# Patient Record
Sex: Female | Born: 1985 | Hispanic: No | Marital: Single | Smoking: Current every day smoker
Health system: Southern US, Community
[De-identification: ages and names within clinical notes are randomized; demographics above are authoritative.]

## PROBLEM LIST (undated history)

## (undated) ENCOUNTER — Inpatient Hospital Stay (HOSPITAL_COMMUNITY): Payer: Self-pay

## (undated) DIAGNOSIS — F419 Anxiety disorder, unspecified: Secondary | ICD-10-CM

## (undated) DIAGNOSIS — F329 Major depressive disorder, single episode, unspecified: Secondary | ICD-10-CM

## (undated) DIAGNOSIS — F32A Depression, unspecified: Secondary | ICD-10-CM

## (undated) HISTORY — PX: TYMPANOSTOMY TUBE PLACEMENT: SHX32

---
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
## 2011-06-24 ENCOUNTER — Emergency Department (HOSPITAL_COMMUNITY)
Admission: EM | Admit: 2011-06-24 | Discharge: 2011-06-24 | Disposition: A | Discharge status: Home / Self Care | Payer: Self-pay

## 2013-08-13 ENCOUNTER — Emergency Department (HOSPITAL_COMMUNITY)
Admission: EM | Admit: 2013-08-13 | Discharge: 2013-08-13 | Disposition: A | Discharge status: Home / Self Care | Payer: Self-pay | Attending: Emergency Medicine | Admitting: Emergency Medicine

## 2013-08-13 ENCOUNTER — Encounter (HOSPITAL_COMMUNITY): Payer: Self-pay | Admitting: Emergency Medicine

## 2013-08-13 DIAGNOSIS — M549 Dorsalgia, unspecified: Secondary | ICD-10-CM | POA: Insufficient documentation

## 2013-08-13 DIAGNOSIS — Z8659 Personal history of other mental and behavioral disorders: Secondary | ICD-10-CM | POA: Insufficient documentation

## 2013-08-13 DIAGNOSIS — R51 Headache: Secondary | ICD-10-CM | POA: Insufficient documentation

## 2013-08-13 DIAGNOSIS — F17200 Nicotine dependence, unspecified, uncomplicated: Secondary | ICD-10-CM | POA: Insufficient documentation

## 2013-08-13 DIAGNOSIS — R519 Headache, unspecified: Secondary | ICD-10-CM

## 2013-08-13 DIAGNOSIS — G479 Sleep disorder, unspecified: Secondary | ICD-10-CM | POA: Insufficient documentation

## 2013-08-13 DIAGNOSIS — R079 Chest pain, unspecified: Secondary | ICD-10-CM | POA: Insufficient documentation

## 2013-08-13 DIAGNOSIS — F430 Acute stress reaction: Secondary | ICD-10-CM | POA: Insufficient documentation

## 2013-08-13 HISTORY — DX: Anxiety disorder, unspecified: F41.9

## 2013-08-13 HISTORY — DX: Major depressive disorder, single episode, unspecified: F32.9

## 2013-08-13 HISTORY — DX: Depression, unspecified: F32.A

## 2013-08-13 LAB — CBC WITH DIFFERENTIAL/PLATELET
Basophils Absolute: 0 10*3/uL (ref 0.0–0.1)
Basophils Relative: 1 % (ref 0–1)
Eosinophils Absolute: 0.1 10*3/uL (ref 0.0–0.7)
Eosinophils Relative: 2 % (ref 0–5)
HCT: 37.1 % (ref 36.0–46.0)
HEMOGLOBIN: 12.8 g/dL (ref 12.0–15.0)
Lymphocytes Relative: 24 % (ref 12–46)
Lymphs Abs: 1.2 10*3/uL (ref 0.7–4.0)
MCH: 33.1 pg (ref 26.0–34.0)
MCHC: 34.5 g/dL (ref 30.0–36.0)
MCV: 95.9 fL (ref 78.0–100.0)
Monocytes Absolute: 0.6 10*3/uL (ref 0.1–1.0)
Monocytes Relative: 12 % (ref 3–12)
NEUTROS ABS: 3.1 10*3/uL (ref 1.7–7.7)
NEUTROS PCT: 62 % (ref 43–77)
PLATELETS: 218 10*3/uL (ref 150–400)
RBC: 3.87 MIL/uL (ref 3.87–5.11)
RDW: 12.1 % (ref 11.5–15.5)
WBC: 5 10*3/uL (ref 4.0–10.5)

## 2013-08-13 MED ORDER — METOCLOPRAMIDE HCL 5 MG/ML IJ SOLN
10.0000 mg | Freq: Once | INTRAMUSCULAR | Status: AC
  Administered 2013-08-13: 10 mg via INTRAMUSCULAR
  Filled 2013-08-13: qty 2

## 2013-08-13 MED ORDER — DIPHENHYDRAMINE HCL 50 MG/ML IJ SOLN
25.0000 mg | Freq: Once | INTRAMUSCULAR | Status: AC
  Administered 2013-08-13: 25 mg via INTRAMUSCULAR
  Filled 2013-08-13: qty 1

## 2013-08-13 MED ORDER — OXYCODONE-ACETAMINOPHEN 5-325 MG PO TABS
2.0000 | ORAL_TABLET | Freq: Once | ORAL | Status: AC
  Administered 2013-08-13: 2 via ORAL
  Filled 2013-08-13: qty 2

## 2013-08-13 MED ORDER — KETOROLAC TROMETHAMINE 60 MG/2ML IM SOLN
60.0000 mg | Freq: Once | INTRAMUSCULAR | Status: AC
  Administered 2013-08-13: 60 mg via INTRAMUSCULAR
  Filled 2013-08-13: qty 2

## 2013-08-13 NOTE — Discharge Instructions (Signed)

## 2013-08-13 NOTE — ED Notes (Signed)
Pt states headache for 2 weeks.  Pt states eye movement hurts.  Ibuprofen is not working.  ASA, excedrin, goody's.  No migraines in past.  Diarrhea 2 days ago.  No vomiting or fever.  Slight blurred vision.

## 2013-08-13 NOTE — ED Provider Notes (Signed)
CSN: [id number]     Arrival date & time 08/13/13  1840 History   First MD Initiated Contact with Patient 08/13/13 2003     Chief Complaint  Patient presents with  . Migraine     (Consider location/radiation/quality/duration/timing/severity/associated sxs/prior Treatment) Patient is a 28 y.o. female presenting with migraines. The history is provided by the patient.  Migraine   patient here complaining of bitemporal headache x2 weeks. Denies any fever or neck pain. No photophobia. Headache has been persistent. She notes increased stress and decreased sleep. Denies any rashes. No recent travel history. Also notes musculoskeletal pain in her back and chest worse with movement. She works as a Child psychotherapistwaitress. Denies any syncope or near-syncope. Symptoms persisted. Has used ibuprofen without relief. Denies any focal neurological weakness.  Past Medical History  Diagnosis Date  . Anxiety   . Depression    Past Surgical History  Procedure Laterality Date  . Tympanostomy tube placement     History reviewed. No pertinent family history. History  Substance Use Topics  . Smoking status: Current Every Day Smoker -- 1.00 packs/day  . Smokeless tobacco: Not on file  . Alcohol Use: Yes     Comment: social   OB History   Grav Para Term Preterm Abortions TAB SAB Ect Mult Living                 Review of Systems  All other systems reviewed and are negative.     Allergies  Review of patient's allergies indicates no known allergies.  Home Medications   Prior to Admission medications   Not on File   BP 119/88  Pulse 79  Temp(Src) 98.2 F (36.8 C) (Oral)  Resp 16  SpO2 100%  LMP 08/06/2013 Physical Exam  Nursing note and vitals reviewed. Constitutional: She is oriented to person, place, and time. She appears well-developed and well-nourished.  Non-toxic appearance. No distress.  HENT:  Head: Normocephalic and atraumatic.  Eyes: Conjunctivae, EOM and lids are normal. Pupils are equal,  round, and reactive to light.  Neck: Normal range of motion. Neck supple. No tracheal deviation present. No mass present.    Cardiovascular: Normal rate, regular rhythm and normal heart sounds.  Exam reveals no gallop.   No murmur heard. Pulmonary/Chest: Effort normal and breath sounds normal. No stridor. No respiratory distress. She has no decreased breath sounds. She has no wheezes. She has no rhonchi. She has no rales.    Abdominal: Soft. Normal appearance and bowel sounds are normal. She exhibits no distension. There is no tenderness. There is no rebound and no CVA tenderness.  Musculoskeletal: Normal range of motion. She exhibits no edema and no tenderness.  Neurological: She is alert and oriented to person, place, and time. She has normal strength. No cranial nerve deficit or sensory deficit. GCS eye subscore is 4. GCS verbal subscore is 5. GCS motor subscore is 6.  Skin: Skin is warm and dry. No abrasion and no rash noted.  Psychiatric: She has a normal mood and affect. Her speech is normal and behavior is normal.    ED Course  Procedures (including critical care time) Labs Review Labs Reviewed - No data to display  Imaging Review No results found.   EKG Interpretation None      MDM   Final diagnoses:  None    Patient says she may be anemic and CBG was checked and shows no signs of anemia. Was given medications for her headache. No red flags for subarachnoid  hemorrhage or meningitis. Repeat neurological exam at time of discharge stable.    Toy BakerAnthony T Deneisha Dade, MD 08/13/13 2228

## 2015-01-14 ENCOUNTER — Emergency Department (HOSPITAL_COMMUNITY): Payer: BLUE CROSS/BLUE SHIELD

## 2015-01-14 ENCOUNTER — Emergency Department (HOSPITAL_COMMUNITY)
Admission: EM | Admit: 2015-01-14 | Discharge: 2015-01-14 | Disposition: A | Discharge status: Home / Self Care | Payer: BLUE CROSS/BLUE SHIELD | Attending: Emergency Medicine | Admitting: Emergency Medicine

## 2015-01-14 ENCOUNTER — Encounter (HOSPITAL_COMMUNITY): Payer: Self-pay | Admitting: Emergency Medicine

## 2015-01-14 DIAGNOSIS — Y9289 Other specified places as the place of occurrence of the external cause: Secondary | ICD-10-CM | POA: Insufficient documentation

## 2015-01-14 DIAGNOSIS — W208XXA Other cause of strike by thrown, projected or falling object, initial encounter: Secondary | ICD-10-CM | POA: Diagnosis not present

## 2015-01-14 DIAGNOSIS — F419 Anxiety disorder, unspecified: Secondary | ICD-10-CM | POA: Diagnosis not present

## 2015-01-14 DIAGNOSIS — S9031XA Contusion of right foot, initial encounter: Secondary | ICD-10-CM | POA: Diagnosis not present

## 2015-01-14 DIAGNOSIS — F329 Major depressive disorder, single episode, unspecified: Secondary | ICD-10-CM | POA: Insufficient documentation

## 2015-01-14 DIAGNOSIS — Y9389 Activity, other specified: Secondary | ICD-10-CM | POA: Diagnosis not present

## 2015-01-14 DIAGNOSIS — Z79899 Other long term (current) drug therapy: Secondary | ICD-10-CM | POA: Diagnosis not present

## 2015-01-14 DIAGNOSIS — Z720 Tobacco use: Secondary | ICD-10-CM | POA: Diagnosis not present

## 2015-01-14 DIAGNOSIS — S99921A Unspecified injury of right foot, initial encounter: Secondary | ICD-10-CM | POA: Diagnosis present

## 2015-01-14 DIAGNOSIS — Y998 Other external cause status: Secondary | ICD-10-CM | POA: Insufficient documentation

## 2015-01-14 MED ORDER — NAPROXEN 500 MG PO TABS: 500.0000 mg | ORAL_TABLET | Freq: Two times a day (BID) | ORAL | Status: DC

## 2015-01-14 NOTE — Discharge Instructions (Signed)
Naprosyn for pain and inflammation. Ice, elevate, crutches as needed. Follow up with primary care doctor.   Foot Contusion A foot contusion is a deep bruise to the foot. Contusions are the result of an injury that caused bleeding under the skin. The contusion may turn blue, purple, or yellow. Minor injuries will give you a painless contusion, but more severe contusions may stay painful and swollen for a few weeks. CAUSES  A foot contusion comes from a direct blow to that area, such as a heavy object falling on the foot. SYMPTOMS   Swelling of the foot.  Discoloration of the foot.  Tenderness or soreness of the foot. DIAGNOSIS  You will have a physical exam and will be asked about your history. You may need an X-ray of your foot to look for a broken bone (fracture).  TREATMENT  An elastic wrap may be recommended to support your foot. Resting, elevating, and applying cold compresses to your foot are often the best treatments for a foot contusion. Over-the-counter medicines may also be recommended for pain control. HOME CARE INSTRUCTIONS   Put ice on the injured area.  Put ice in a plastic bag.  Place a towel between your skin and the bag.  Leave the ice on for 15-20 minutes, 03-04 times a day.  Only take over-the-counter or prescription medicines for pain, discomfort, or fever as directed by your caregiver.  If told, use an elastic wrap as directed. This can help reduce swelling. You may remove the wrap for sleeping, showering, and bathing. If your toes become numb, cold, or blue, take the wrap off and reapply it more loosely.  Elevate your foot with pillows to reduce swelling.  Try to avoid standing or walking while the foot is painful. Do not resume use until instructed by your caregiver. Then, begin use gradually. If pain develops, decrease use. Gradually increase activities that do not cause discomfort until you have normal use of your foot.  See your caregiver as directed. It is  very important to keep all follow-up appointments in order to avoid any lasting problems with your foot, including long-term (chronic) pain. SEEK IMMEDIATE MEDICAL CARE IF:   You have increased redness, swelling, or pain in your foot.  Your swelling or pain is not relieved with medicines.  You have loss of feeling in your foot or are unable to move your toes.  Your foot turns cold or blue.  You have pain when you move your toes.  Your foot becomes warm to the touch.  Your contusion does not improve in 2 days. MAKE SURE YOU:   Understand these instructions.  Will watch your condition.  Will get help right away if you are not doing well or get worse. Document Released: 01/23/2006 Document Revised: 10/03/2011 Document Reviewed: 03/07/2011 [facility] Patient Information 2015 [place], [place]. This information is not intended to replace advice given to you by your health care provider. Make sure you discuss any questions you have with your health care provider.

## 2015-01-14 NOTE — ED Notes (Signed)
Pt c/o right anterior foot injury onset 2 days ago after dropping can on foot, hematoma, laceration, ecchymosis present to right foot.

## 2015-01-14 NOTE — ED Provider Notes (Signed)
CSN: [id number]     Arrival date & time 01/14/15  1703 History  By signing my name below, I, Cathy Henry, attest that this documentation has been prepared under the direction and in the presence of non-physician practitioner, Jaynie Crumble, PA-C. Electronically Signed: Freida Henry, Scribe. 01/14/2015. 5:43 PM.    Chief Complaint  Patient presents with  . Foot Injury    The history is provided by the patient. No language interpreter was used.    HPI Comments:  Cathy Henry is a 29 y.o. female who presents to the Emergency Department complaining of moderate pain and swelling to her right foot after injury 2 days ago. Pt states she dropped a cannned food on the foot. She notes the pain and swelling has increased since injury and her pain is exacerbated when she bears weight on the extremity. She has been icing, and elevating the foot as well as taking tylenol and ibuprofen with little relief.   Past Medical History  Diagnosis Date  . Anxiety   . Depression    Past Surgical History  Procedure Laterality Date  . Tympanostomy tube placement     History reviewed. No pertinent family history. Social History  Substance Use Topics  . Smoking status: Current Every Day Smoker -- 1.00 packs/day  . Smokeless tobacco: None  . Alcohol Use: Yes     Comment: social   OB History    No data available     Review of Systems  Constitutional: Negative for fever and chills.  Musculoskeletal: Positive for myalgias and arthralgias.       + Right foot swelling    Allergies  Review of patient's allergies indicates no known allergies.  Home Medications   Prior to Admission medications   Medication Sig Start Date End Date Taking? Authorizing Provider  acetaminophen (TYLENOL) 325 MG tablet Take 650 mg by mouth every 6 (six) hours as needed (pain).    Historical Provider, MD  aspirin-acetaminophen-caffeine (EXCEDRIN MIGRAINE) 2257486598 MG per tablet Take 2 tablets by mouth every 6 (six)  hours as needed for headache (pain).    Historical Provider, MD  Aspirin-Acetaminophen-Caffeine 260-130-16 MG TABS Take 1 Package by mouth 2 (two) times daily as needed (headache).    Historical Provider, MD  FLUoxetine (PROZAC) 40 MG capsule Take 40 mg by mouth at bedtime.    Historical Provider, MD  ibuprofen (ADVIL,MOTRIN) 200 MG tablet Take 800 mg by mouth every 4 (four) hours as needed (pain).    Historical Provider, MD   BP 137/95 mmHg  Pulse 108  Temp(Src) 98.2 F (36.8 C) (Oral)  Resp 18  SpO2 99%  LMP 12/31/2014 Physical Exam  Constitutional: She is oriented to person, place, and time. She appears well-developed and well-nourished.  HENT:  Head: Normocephalic and atraumatic.  Eyes: Conjunctivae are normal.  Cardiovascular: Normal rate.   Pulmonary/Chest: Effort normal.  Abdominal: She exhibits no distension.  Musculoskeletal:  Mild swelling with bruising to the dorsal right foot. Diffuse foot ttp. Normal ankle. Pain with ROM of MTP joint of all toes. Normal toes with cap refill <2 sec.   Neurological: She is alert and oriented to person, place, and time.  Skin: Skin is warm and dry.  1cm superficial laceration tot he right dorsal foot  Psychiatric: She has a normal mood and affect.  Nursing note and vitals reviewed.   ED Course  Procedures   DIAGNOSTIC STUDIES:  Oxygen Saturation is 99% on RA, normal by my interpretation.    COORDINATION  OF CARE:  5:22 PM Discussed treatment plan with pt at bedside and pt agreed to plan.  Labs Review Labs Reviewed - No data to display  Imaging Review Dg Foot Complete Right  01/14/2015   CLINICAL DATA:  Blunt trauma to the right foot 2 years ago, ecchymosis over the dorsum of the right foot  EXAM: RIGHT FOOT COMPLETE - 3+ VIEW  COMPARISON:  None.  FINDINGS: There is no evidence of fracture or dislocation. There is no evidence of arthropathy or other focal bone abnormality. Soft tissues are unremarkable. Bones are subjectively  osteopenic out of proportion for the patient's age.  IMPRESSION: Negative.   Electronically Signed   By: Christiana Pellant M.D.   On: 01/14/2015 17:46   I have personally reviewed and evaluated these images as part of my medical decision-making.   EKG Interpretation None      MDM   Final diagnoses:  Foot contusion, right, initial encounter    Pt with right foot contusion, small 1cm laceration to the dorsal foot. neurovascularly intact, with cap refill <2 sec in toes. Doubt compartment syndrome, xray negative tetanus updated. Home with crutches, ice, elevation. Most likely deep tissue contusion, unable to completely assess stability and strength of tendons due to pain. Pt informed of this.   Filed Vitals:   01/14/15 1710 01/14/15 1712  BP: 137/95   Pulse: 108   Temp:  98.2 F (36.8 C)  TempSrc:  Oral  Resp:  18  SpO2: 99%     I personally performed the services described in this documentation, which was scribed in my presence. The recorded information has been reviewed and is accurate.   Jaynie Crumble, PA-C 01/14/15 1911  Alvira Monday, MD 01/15/15 [phone]

## 2016-03-10 IMAGING — CR DG FOOT COMPLETE 3+V*R*
3 series · 3 of 3 positions shown · non-contrast
Comparison: None.

CLINICAL DATA: Blunt trauma to the right foot 2 years ago,
ecchymosis over the dorsum of the right foot

EXAM:
RIGHT FOOT COMPLETE - 3+ VIEW

[x foot ap right]
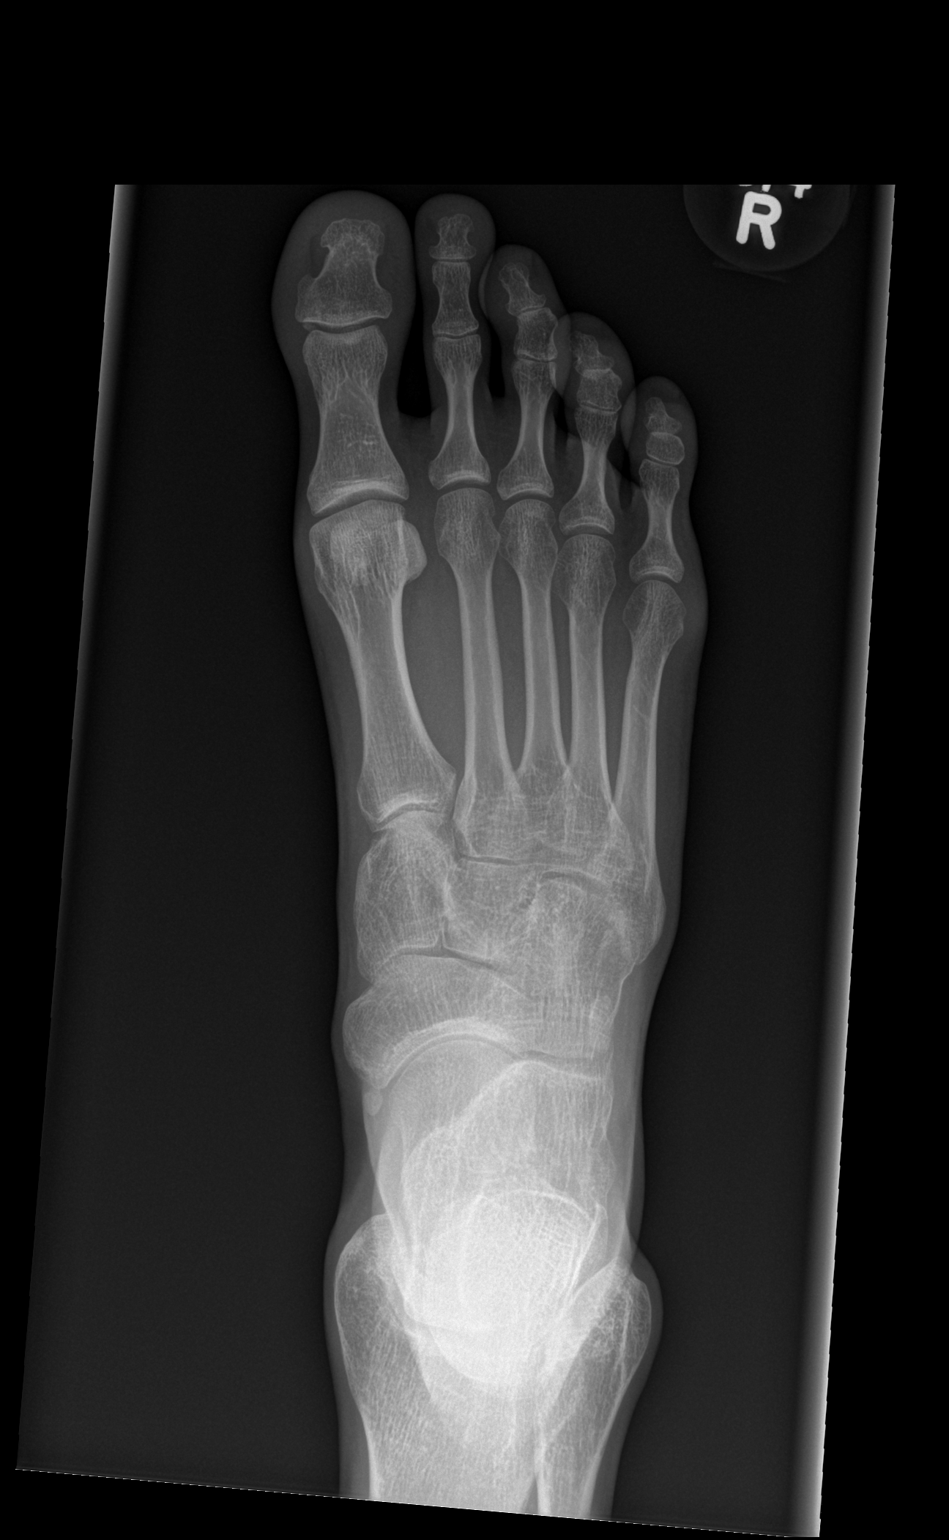

[x foot obl right]
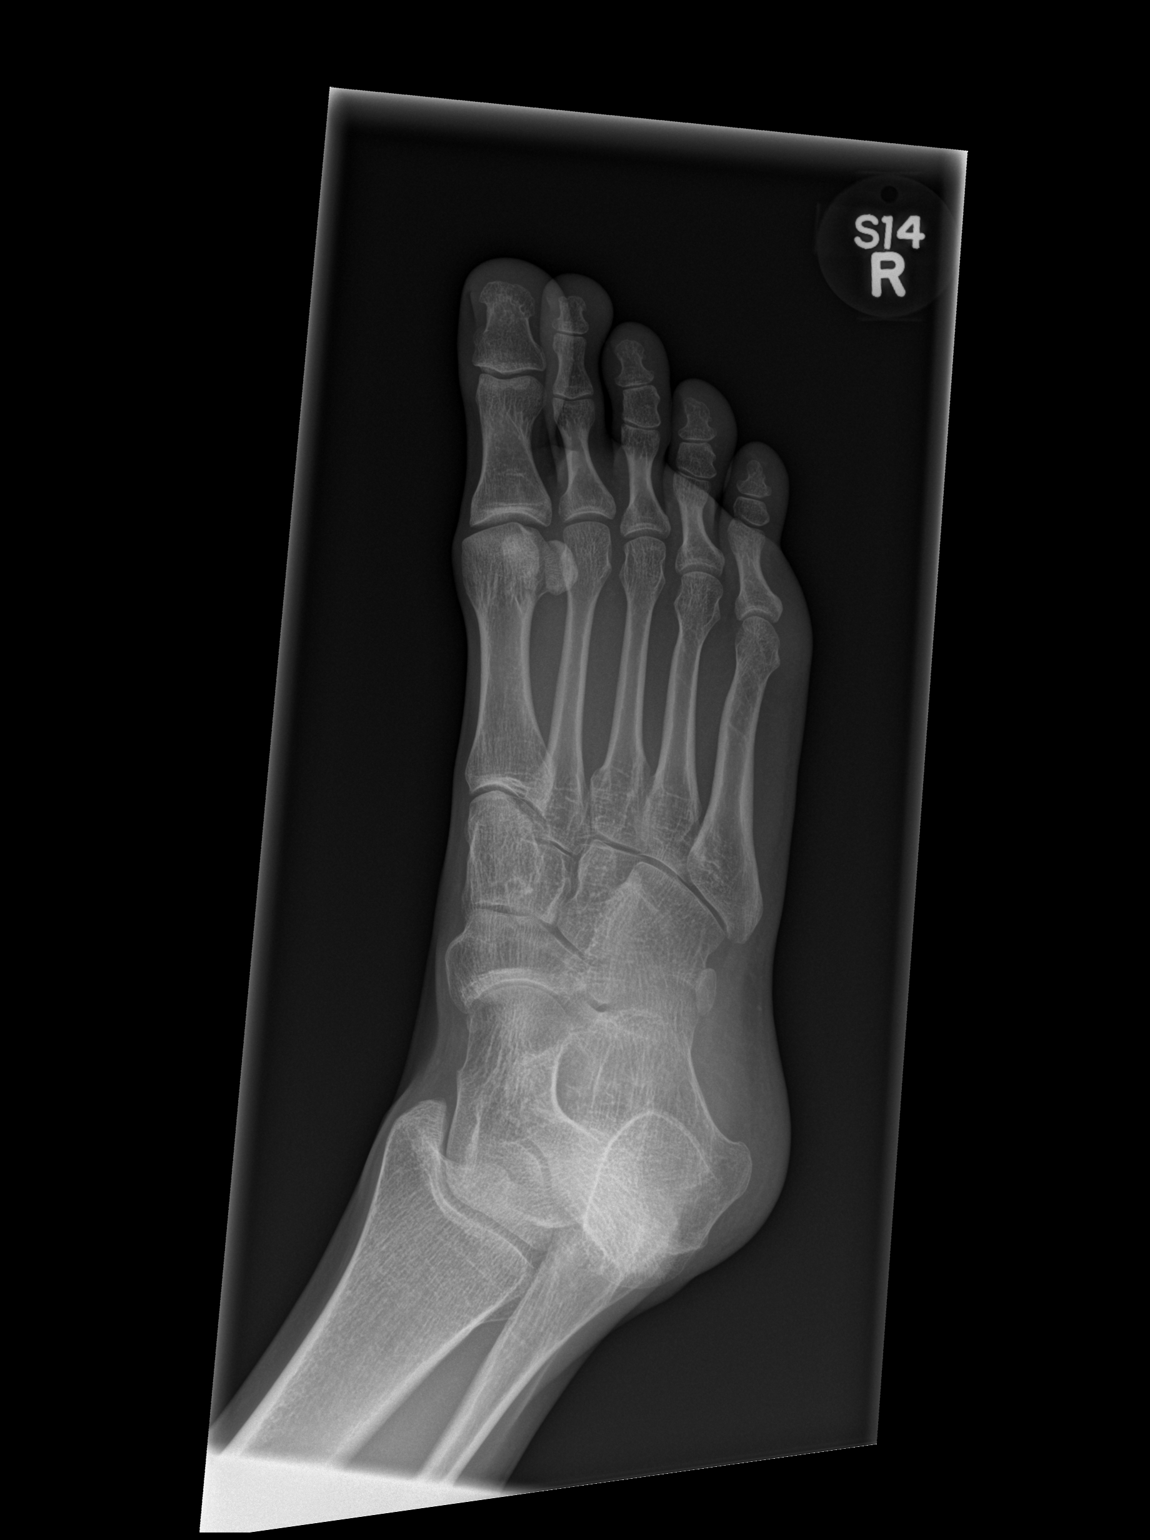

[x foot lat right]
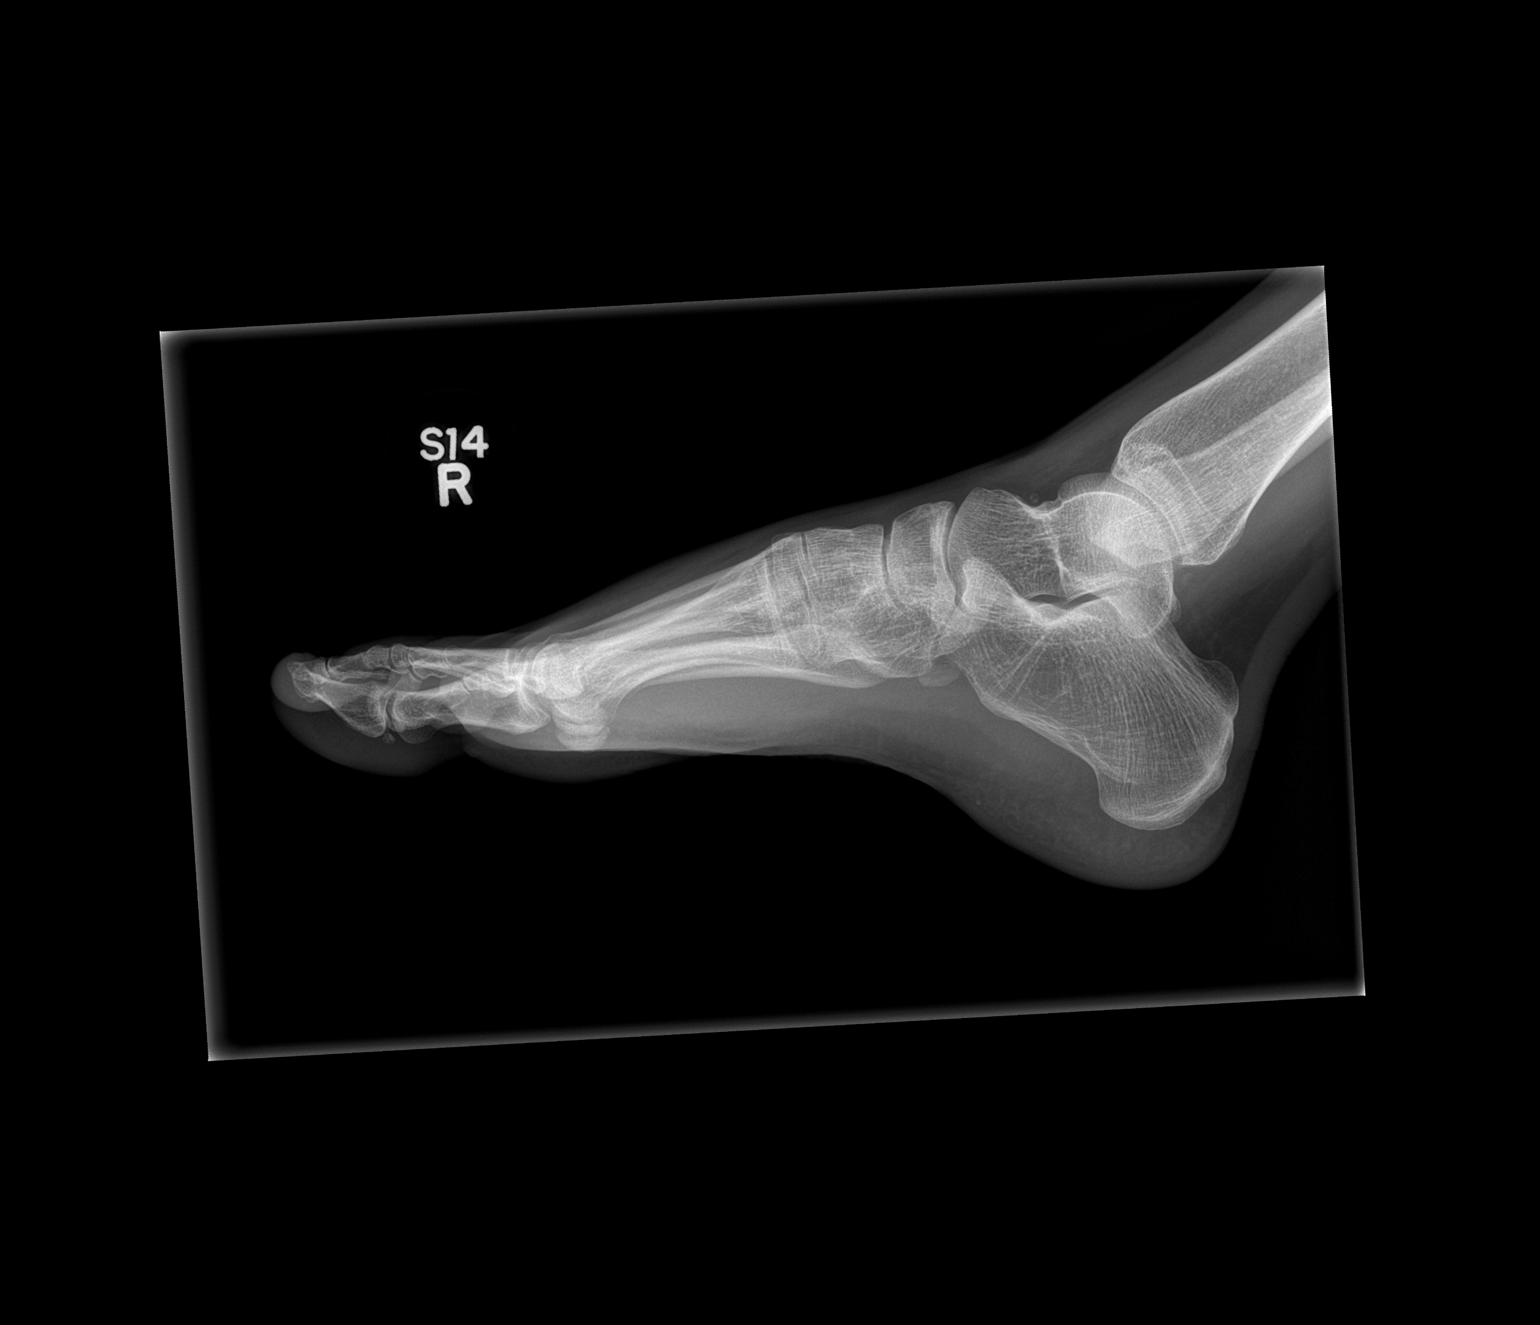

[3 of 3 positions shown; findings below may reference images not displayed]

FINDINGS: There is no evidence of fracture or dislocation. There is no
evidence of arthropathy or other focal bone abnormality. Soft
tissues are unremarkable. Bones are subjectively osteopenic out of
proportion for the patient's age.
IMPRESSION: Negative.

## 2022-07-05 ENCOUNTER — Ambulatory Visit
Admission: RE | Admit: 2022-07-05 | Discharge: 2022-07-05 | Disposition: A | Discharge status: Home / Self Care | Payer: Self-pay | Source: Ambulatory Visit | Attending: Urgent Care | Admitting: Urgent Care

## 2022-07-05 VITALS — BP 144/93 | HR 124 | Temp 98.3°F | Resp 16 | Ht 65.0 in | Wt 120.0 lb

## 2022-07-05 DIAGNOSIS — J018 Other acute sinusitis: Secondary | ICD-10-CM

## 2022-07-05 DIAGNOSIS — R000 Tachycardia, unspecified: Secondary | ICD-10-CM

## 2022-07-05 DIAGNOSIS — H65193 Other acute nonsuppurative otitis media, bilateral: Secondary | ICD-10-CM

## 2022-07-05 DIAGNOSIS — B356 Tinea cruris: Secondary | ICD-10-CM

## 2022-07-05 MED ORDER — IPRATROPIUM BROMIDE 0.03 % NA SOLN: 2.0000 | Freq: Two times a day (BID) | NASAL | 0 refills | Status: DC

## 2022-07-05 MED ORDER — CETIRIZINE HCL 10 MG PO TABS: 10.0000 mg | ORAL_TABLET | Freq: Every day | ORAL | 0 refills | Status: DC

## 2022-07-05 MED ORDER — AMOXICILLIN-POT CLAVULANATE 875-125 MG PO TABS: 1.0000 | ORAL_TABLET | Freq: Two times a day (BID) | ORAL | 0 refills | Status: DC

## 2022-07-05 MED ORDER — PSEUDOEPHEDRINE HCL 60 MG PO TABS: 60.0000 mg | ORAL_TABLET | Freq: Three times a day (TID) | ORAL | 0 refills | Status: DC | PRN

## 2022-07-05 MED ORDER — KETOCONAZOLE 2 % EX CREA: 1.0000 | TOPICAL_CREAM | Freq: Every day | CUTANEOUS | 1 refills | Status: DC

## 2022-07-05 MED ORDER — FLUCONAZOLE 150 MG PO TABS: 150.0000 mg | ORAL_TABLET | ORAL | 0 refills | Status: DC

## 2022-07-05 NOTE — ED Triage Notes (Signed)
Pt states congestion in her head and bilateral ears. Pt also has a rash to her bilateral groin that she would like to have looked at as well.

## 2022-07-05 NOTE — ED Provider Notes (Signed)
[facility]  Note:  This document was prepared using Systems analyst and may include unintentional dictation errors.  MRN: [id number] DOB: [birth year]  Subjective:   Cathy Henry is a 37 y.o. female presenting for 2-week history of acute onset persistent and worsening sinus congestion, sinus drainage, sinus headaches.  In the past week she has developed severe persistent left ear pain but feels fullness across both ears.  Has a history of ear infections and sinus infections.  Has been trying to use multiple over-the-counter medications but has not had much relief.  She is also developed a several week history of persistent discolored rash over the groin area.  Had 1 similarly years ago that resolved on its own.  Denies fever, n/v, abdominal pain, pelvic pain, rashes, dysuria, urinary frequency, hematuria, vaginal discharge.  At triage, patient has noted to have an elevated pulse.  Denies any history of arrhythmias, SVT.  Reports that normally her pulse is not that high.  No chest pain, shortness of breath.   No current facility-administered medications for this encounter. No current outpatient medications on file.   No Known Allergies  History reviewed. No pertinent past medical history.   History reviewed. No pertinent surgical history.  History reviewed. No pertinent family history.  Social History   Tobacco Use   Smoking status: Every Day    Types: Cigarettes   Smokeless tobacco: Never  Vaping Use   Vaping Use: Never used  Substance Use Topics   Alcohol use: Not Currently   Drug use: Never    ROS   Objective:   Vitals: BP (!) 144/93 (BP Location: Right Arm)   Pulse (!) 124   Temp 98.3 F (36.8 C) (Oral)   Resp 16   Ht 5\' 5"  (1.651 m)   Wt 120 lb (54.4 kg)   LMP 06/06/2022 (Approximate)   SpO2 97%   BMI 19.97 kg/m   Physical Exam Constitutional:      General: She is not in acute distress.    Appearance: Normal  appearance. She is well-developed and normal weight. She is not ill-appearing, toxic-appearing or diaphoretic.  HENT:     Head: Normocephalic and atraumatic.     Right Ear: Ear canal and external ear normal. No drainage or tenderness. No middle ear effusion. There is no impacted cerumen. Tympanic membrane is erythematous. Tympanic membrane is not bulging.     Left Ear: Ear canal and external ear normal. No drainage or tenderness.  No middle ear effusion. There is no impacted cerumen. Tympanic membrane is erythematous and bulging.     Nose: Congestion present. No rhinorrhea.     Mouth/Throat:     Mouth: Mucous membranes are moist. No oral lesions.     Pharynx: No pharyngeal swelling, oropharyngeal exudate, posterior oropharyngeal erythema or uvula swelling.     Tonsils: No tonsillar exudate or tonsillar abscesses.  Eyes:     General: No scleral icterus.       Right eye: No discharge.        Left eye: No discharge.     Extraocular Movements: Extraocular movements intact.     Right eye: Normal extraocular motion.     Left eye: Normal extraocular motion.     Conjunctiva/sclera: Conjunctivae normal.  Cardiovascular:     Rate and Rhythm: Normal rate and regular rhythm.     Heart sounds: Normal heart sounds. No murmur heard.    No friction rub. No gallop.  Pulmonary:  Effort: Pulmonary effort is normal. No respiratory distress.     Breath sounds: No stridor. No wheezing, rhonchi or rales.  Chest:     Chest wall: No tenderness.  Musculoskeletal:     Cervical back: Normal range of motion and neck supple.  Lymphadenopathy:     Cervical: No cervical adenopathy.  Skin:    General: Skin is warm and dry.          Comments: RN Rosana Hoes assisted as a female chaperone.  Neurological:     General: No focal deficit present.     Mental Status: She is alert and oriented to person, place, and time.  Psychiatric:        Mood and Affect: Mood normal.        Behavior: Behavior normal.      Assessment and Plan :   PDMP not reviewed this encounter.  1. Other non-recurrent acute nonsuppurative otitis media of both ears   2. Acute non-recurrent sinusitis of other sinus   3. Tinea cruris     Will start empiric treatment for sinusitis and associated bilateral otitis media worse to the left with Augmentin.  Recommended supportive care otherwise.  Recommended avoiding decongestants.  Will use Atrovent instead for the sinuses and as a nasal decongestant.  Monitor for the sustained tachycardia.  Currently I suspect that it is related to her infection and pain.  Counseled patient on potential for adverse effects with medications prescribed/recommended today, strict ER and return-to-clinic precautions discussed, patient verbalized understanding.    Jaynee Eagles, [place] 07/05/22 [phone]

## 2023-11-10 ENCOUNTER — Encounter (HOSPITAL_BASED_OUTPATIENT_CLINIC_OR_DEPARTMENT_OTHER): Payer: Self-pay | Admitting: Radiology

## 2023-11-10 ENCOUNTER — Other Ambulatory Visit: Payer: Self-pay

## 2023-11-10 ENCOUNTER — Emergency Department (HOSPITAL_BASED_OUTPATIENT_CLINIC_OR_DEPARTMENT_OTHER)
Admission: EM | Admit: 2023-11-10 | Discharge: 2023-11-10 | Disposition: A | Discharge status: Home / Self Care | Payer: Self-pay | Attending: Emergency Medicine | Admitting: Emergency Medicine

## 2023-11-10 DIAGNOSIS — R42 Dizziness and giddiness: Secondary | ICD-10-CM | POA: Insufficient documentation

## 2023-11-10 DIAGNOSIS — Z7982 Long term (current) use of aspirin: Secondary | ICD-10-CM | POA: Insufficient documentation

## 2023-11-10 DIAGNOSIS — R55 Syncope and collapse: Secondary | ICD-10-CM | POA: Insufficient documentation

## 2023-11-10 DIAGNOSIS — F17200 Nicotine dependence, unspecified, uncomplicated: Secondary | ICD-10-CM | POA: Insufficient documentation

## 2023-11-10 LAB — URINALYSIS, ROUTINE W REFLEX MICROSCOPIC
Bilirubin Urine: NEGATIVE
Glucose, UA: NEGATIVE mg/dL
Hgb urine dipstick: NEGATIVE
Ketones, ur: NEGATIVE mg/dL
Leukocytes,Ua: NEGATIVE
Nitrite: NEGATIVE
Protein, ur: NEGATIVE mg/dL
Specific Gravity, Urine: <1.005 — ABNORMAL LOW (ref 1.005–1.030)
pH: 7 (ref 5.0–8.0)

## 2023-11-10 LAB — COMPREHENSIVE METABOLIC PANEL WITH GFR
ALT: 36 U/L (ref 0–44)
AST: 36 U/L (ref 15–41)
Albumin: 4.5 g/dL (ref 3.5–5.0)
Alkaline Phosphatase: 65 U/L (ref 38–126)
Anion gap: 12 (ref 5–15)
BUN: 11 mg/dL (ref 6–20)
CO2: 23 mmol/L (ref 22–32)
Calcium: 9.2 mg/dL (ref 8.9–10.3)
Chloride: 95 mmol/L — ABNORMAL LOW (ref 98–111)
Creatinine, Ser: 0.73 mg/dL (ref 0.44–1.00)
GFR, Estimated: >60 mL/min (ref >60)
Glucose, Bld: 137 mg/dL — ABNORMAL HIGH (ref 70–99)
Potassium: 4.5 mmol/L (ref 3.5–5.1)
Sodium: 130 mmol/L — ABNORMAL LOW (ref 135–145)
Total Bilirubin: 0.3 mg/dL (ref 0.0–1.2)
Total Protein: 7.7 g/dL (ref 6.5–8.1)

## 2023-11-10 LAB — CBC
HCT: 38.4 % (ref 36.0–46.0)
Hemoglobin: 13.3 g/dL (ref 12.0–15.0)
MCH: 32.4 pg (ref 26.0–34.0)
MCHC: 34.6 g/dL (ref 30.0–36.0)
MCV: 93.4 fL (ref 80.0–100.0)
Platelets: 295 K/uL (ref 150–400)
RBC: 4.11 MIL/uL (ref 3.87–5.11)
RDW: 11.6 % (ref 11.5–15.5)
WBC: 10.3 K/uL (ref 4.0–10.5)
nRBC: 0 % (ref 0.0–0.2)

## 2023-11-10 LAB — ETHANOL: Alcohol, Ethyl (B): <15 mg/dL (ref <15)

## 2023-11-10 LAB — PREGNANCY, URINE: Preg Test, Ur: NEGATIVE

## 2023-11-10 LAB — CBG MONITORING, ED: Glucose-Capillary: 153 mg/dL — ABNORMAL HIGH (ref 70–99)

## 2023-11-10 MED ORDER — SODIUM CHLORIDE 0.9 % IV BOLUS
1000.0000 mL | Freq: Once | INTRAVENOUS | Status: AC
  Administered 2023-11-10: 1000 mL via INTRAVENOUS

## 2023-11-10 NOTE — ED Provider Notes (Signed)
 [place] EMERGENCY DEPARTMENT AT [facility] Provider Note   CSN: [id number] Arrival date & time: 11/10/23  8251     Patient presents with: Dizziness   Cathy Henry is a 38 y.o. female with past medical history of anxiety, depression, chronic EtOH use presents to Emergency Department for evaluation of presyncope reports that [place] in [place] for 30 minutes when she got a beer and started feeling malaise, tunnel vision, lightheaded.  She reports that she drinks adequate amount of water but only ate 3 chicken nuggets today.  Following the symptoms, she started breathing rapidly and started feeling paresthesia in fingers and toes bilaterally.  Was evaluated by medical staff at [facility] [place] who said that her sugar was on lower side of normal.  Denies complaints of chest pain, shortness of breath, complaints yesterday, NVD, abdominal pain     Dizziness      Prior to Admission medications   Medication Sig Start Date End Date Taking? Authorizing Provider  acetaminophen  (TYLENOL ) 325 MG tablet Take 650 mg by mouth every 6 (six) hours as needed (pain).    [provider]  amoxicillin -clavulanate (AUGMENTIN ) 875-125 MG tablet Take 1 tablet by mouth 2 (two) times daily. 07/05/22   Christopher Savannah, PA-C  aspirin-acetaminophen -caffeine (EXCEDRIN MIGRAINE) 250-250-65 MG per tablet Take 2 tablets by mouth every 6 (six) hours as needed for headache (pain).    [provider]  Aspirin-Acetaminophen -Caffeine 260-130-16 MG TABS Take 1 Package by mouth 2 (two) times daily as needed (headache).    [provider]  cetirizine  (ZYRTEC  ALLERGY) 10 MG tablet Take 1 tablet (10 mg total) by mouth daily. 07/05/22   Christopher Savannah, PA-C  fluconazole  (DIFLUCAN ) 150 MG tablet Take 1 tablet (150 mg total) by mouth once a week. 07/05/22   Christopher Savannah, PA-C  FLUoxetine (PROZAC) 40 MG capsule Take 40 mg by mouth at bedtime.    [provider]  ibuprofen (ADVIL,MOTRIN) 200 MG tablet Take 800 mg by mouth every 4 (four) hours as needed (pain).    [provider]  ipratropium (ATROVENT ) 0.03 % nasal spray Place 2 sprays into both nostrils 2 (two) times daily. 07/05/22   Christopher Savannah, PA-C  ketoconazole  (NIZORAL ) 2 % cream Apply 1 Application topically daily. 07/05/22   Christopher Savannah, PA-C  naproxen  (NAPROSYN ) 500 MG tablet Take 1 tablet (500 mg total) by mouth 2 (two) times daily. 01/14/15   Kirichenko, Tatyana, PA-C    Allergies: Patient has no known allergies.    Review of Systems  Neurological:  Positive for dizziness.    Updated Vital Signs BP 134/85   Pulse 81   Temp 98.3 F (36.8 C) (Oral)   Resp 13   Ht 5' 5 (1.651 m)   Wt 54.4 kg   SpO2 99%   BMI 19.97 kg/m   Physical Exam Vitals and nursing note reviewed.  Constitutional:      General: She is not in acute distress.    Appearance: Normal appearance.  HENT:     Head: Normocephalic and atraumatic.  Eyes:     General: Lids are normal. Vision grossly intact. No visual field deficit.    Extraocular Movements:     Right eye: Normal extraocular motion and no nystagmus.     Left eye: Normal extraocular motion and no nystagmus.     Conjunctiva/sclera: Conjunctivae normal.  Cardiovascular:     Rate and Rhythm: Normal rate.  Heart sounds: Normal heart sounds.  Pulmonary:     Effort: Pulmonary effort is normal. No respiratory distress.     Breath sounds: Normal breath sounds.  Abdominal:     General: Bowel sounds are normal. There is no distension.     Palpations: Abdomen is soft.     Tenderness: There is no abdominal tenderness. There is no right CVA tenderness, left CVA tenderness, guarding or rebound.  Musculoskeletal:     Cervical back: Normal range of motion and neck supple. No rigidity.     Right lower leg: No edema.     Left lower leg: No edema.     Comments: No pedal edema, palpable cords, nor tenderness to BLE.  [place]' sign negative x 2   Skin:    Capillary Refill: Capillary refill takes less than 2 seconds.     Coloration: Skin is not jaundiced or pale.  Neurological:     Mental Status: She is alert and oriented to person, place, and time. Mental status is at baseline.     GCS: GCS eye subscore is 4. GCS verbal subscore is 5. GCS motor subscore is 6.     Cranial Nerves: No cranial nerve deficit, dysarthria or facial asymmetry.     Sensory: Sensation is intact. No sensory deficit.     Motor: No weakness, tremor, atrophy, abnormal muscle tone, seizure activity or pronator drift.     Coordination: Coordination normal. Finger-Nose-Finger Test and Heel to [place] Test normal.     Gait: Gait is intact.     (all labs ordered are listed, but only abnormal results are displayed) Labs Reviewed  COMPREHENSIVE METABOLIC PANEL WITH GFR - Abnormal; Notable for the following components:      Result Value   Sodium 130 (*)    Chloride 95 (*)    Glucose, Bld 137 (*)    All other components within normal limits  URINALYSIS, ROUTINE W REFLEX MICROSCOPIC - Abnormal; Notable for the following components:   Color, Urine COLORLESS (*)    Specific Gravity, Urine <1.005 (*)    All other components within normal limits  CBG MONITORING, ED - Abnormal; Notable for the following components:   Glucose-Capillary 153 (*)    All other components within normal limits  CBC  PREGNANCY, URINE  ETHANOL  CBG MONITORING, ED    EKG: None  Radiology: No results found.    Medications Ordered in the ED  sodium chloride  0.9 % bolus 1,000 mL (0 mLs Intravenous Stopped 11/10/23 2058)                                   Medical Decision Making Amount and/or Complexity of Data Reviewed Labs: ordered.   Patient presents to the ED for concern of presyncope, this involves an extensive number of treatment options, and is a complaint that carries with it a high risk of complications and morbidity.  The differential diagnosis includes ACS, PE, pneumonia,  infection, UTI, electrolyte abnormality, dehydration, pregnancy, heatstroke, EtOH use, hypoglycemia, hyperglycemia   Co morbidities that complicate the patient evaluation  Chronic EtOH use   Additional history obtained:  Additional history obtained from [facility] and Nursing   External records from outside source obtained and reviewed including triage RN note, partner at bedside large   Lab Tests:  I Ordered, and personally interpreted labs.  The pertinent results include:   Sodium 130 CBG 153 UA without infection or hemoglobin  Cardiac Monitoring:  The patient was maintained on a cardiac monitor.  I personally viewed and interpreted the cardiac monitored which showed an underlying rhythm of: NSR at 86 bpm with no T wave or ST abnormalities   Medicines ordered and prescription drug management:  I ordered medication including IVF  for hydration  Reevaluation of the patient after these medicines showed that the patient stayed the same I have reviewed the patients home medicines and have made adjustments as needed     Problem List / ED Course:  Presyncope Has remained hemodynamically stable here in emergency department with no tachycardia, fever, nor hypotension Orthostatics negative ED workup notable for mild hyponatremia of 130 which may be secondary to chronic EtOH use.  Otherwise not pregnant.  UA without infection.  No anemia nor leukocytosis.  Ethanol WNL with no signs of ETOH withdrawal Provided 1 L IVF and had patient ambulate around ED.  No complaints, dizziness, lightheadedness, presyncope following nor during.  Maintaining oxygen saturation without supplementation. Denies chest pain, shortness of breath, HA, abd pain Low suspicion of PE with no tachycardia nor hypoxia nor complaints of chest pain or shortness of breath Low suspicion of ACS as she has no complaints as noted above.  Had prodrome prior to syncope and does not sound like a cardiac syncopal event.   EKG is unremarkable Low suspicion of CVA/TIA, posterior stroke as she has no complaints of dizziness at rest.  She is neurologically intact and able to ambulate without difficulty No suspicion of ICH, migraine with no head injury.  No complaints of headache. Symptoms likely secondary to being outside in the heat, EtOH use.  I also think that they were worsened by becoming anxious, breathing heavily then causing numbness in hands and feet bilaterally Discussed symptomatic care to include ensuring adequate hydration, slow position changes, getting to ground when feeling presyncope to avoid injury Patient's partner will remain w/ patient to monitor her Provided pcp f/u recommendation but does not have insurance   Reevaluation:  After the interventions noted above, I reevaluated the patient and found that they have :resolved   Social Determinants of Health:  Chronic EtOH use Tobacco abuse No insurance   Dispostion:  After consideration of the diagnostic results and the patients response to treatment, I feel that the patent would benefit from outpatient management symptomatic care.   Discussed ED workup, disposition, return to ED precautions with patient who expresses understanding agrees with plan.  All questions answered to their satisfaction.  They are agreeable to plan.  Discharge instructions provided on paperwork  Final diagnoses:  Postural dizziness with presyncope    ED Discharge Orders     None        Minnie Tinnie BRAVO, PA 11/10/23 2120    Lenor Hollering, MD 11/10/23 2244

## 2023-11-10 NOTE — ED Notes (Signed)
Reviewed discharge instructions and home care with pt. Pt verbalized understanding and had no further questions. Pt exited ED without complications.

## 2023-11-10 NOTE — Discharge Instructions (Addendum)
 Thank you for letting us  evaluate you today.  Your lab work was unremarkable for pregnancy, electrolyte abnormality, infection.  Your urine did not show any signs of infection, blood in urine.  Your sodium was mildly decreased which could be secondary to chronic alcohol use.  Recommend decrease alcohol use  Return to emergency department if you experience altered mentation, numbness or weakness in one-sided body, seizure-like activity, NS at rest, acute sudden worst headache of your life, loss of vision, worsening symptoms

## 2023-11-10 NOTE — ED Triage Notes (Addendum)
 Pt states she was out at [facility] park by the pool for about 30 minutes without getting in. She started drinking really cold water and stood up and felt like she was going to the ground. Pt states she felt like her BS was low. She is not a diabetic. Pt states she is intermittently dizzy with numbness and tingling to her feet, fingers and lips.

## 2024-03-25 ENCOUNTER — Ambulatory Visit: Payer: Self-pay

## 2024-03-25 ENCOUNTER — Ambulatory Visit
Admission: RE | Admit: 2024-03-25 | Discharge: 2024-03-25 | Disposition: A | Discharge status: Home / Self Care | Payer: Self-pay | Source: Ambulatory Visit | Attending: Family Medicine

## 2024-03-25 VITALS — BP 131/84 | HR 81 | Temp 98.4°F | Resp 18

## 2024-03-25 DIAGNOSIS — Z3200 Encounter for pregnancy test, result unknown: Secondary | ICD-10-CM

## 2024-03-25 DIAGNOSIS — Z3201 Encounter for pregnancy test, result positive: Secondary | ICD-10-CM

## 2024-03-25 LAB — POCT URINE PREGNANCY: Preg Test, Ur: POSITIVE — AB

## 2024-03-25 NOTE — Discharge Instructions (Signed)
 Please establish with obstetrician for prenatal care.

## 2024-03-25 NOTE — ED Provider Notes (Signed)
 [facility]    CSN: [id number] Arrival date & time: 03/25/24  1346      History   Chief Complaint Chief Complaint  Patient presents with   Possible Pregnancy    Entered by patient    HPI Cathy Henry is a 38 y.o. female presents for possible pregnancy.  Patient reports her last menstrual cycle was October 9 and she is not on birth control.  She has had symptoms of headache, change in food cravings as well as breast tenderness.  She took 3 home test that were positive and she wants to confirm.  No abdominal pain or vaginal spotting.  No other concerns at this time.   Possible Pregnancy    Past Medical History:  Diagnosis Date   Anxiety    Depression     There are no active problems to display for this patient.   Past Surgical History:  Procedure Laterality Date   TYMPANOSTOMY TUBE PLACEMENT      OB History   No obstetric history on file.      Home Medications    Prior to Admission medications   Medication Sig Start Date End Date Taking? Authorizing Provider  acetaminophen  (TYLENOL ) 325 MG tablet Take 650 mg by mouth every 6 (six) hours as needed (pain).    [provider]  amoxicillin -clavulanate (AUGMENTIN ) 875-125 MG tablet Take 1 tablet by mouth 2 (two) times daily. 07/05/22   Christopher Savannah, PA-C  aspirin-acetaminophen -caffeine (EXCEDRIN MIGRAINE) 250-250-65 MG per tablet Take 2 tablets by mouth every 6 (six) hours as needed for headache (pain).    [provider]  Aspirin-Acetaminophen -Caffeine 260-130-16 MG TABS Take 1 Package by mouth 2 (two) times daily as needed (headache).    [provider]  cetirizine  (ZYRTEC  ALLERGY) 10 MG tablet Take 1 tablet (10 mg total) by mouth daily. 07/05/22   Christopher Savannah, PA-C  fluconazole  (DIFLUCAN ) 150 MG tablet Take 1 tablet (150 mg total) by mouth once a week. 07/05/22   Christopher Savannah, PA-C  FLUoxetine (PROZAC) 40 MG capsule Take 40 mg by mouth at bedtime.    [provider]   ibuprofen (ADVIL,MOTRIN) 200 MG tablet Take 800 mg by mouth every 4 (four) hours as needed (pain).    [provider]  ipratropium (ATROVENT ) 0.03 % nasal spray Place 2 sprays into both nostrils 2 (two) times daily. 07/05/22   Christopher Savannah, PA-C  ketoconazole  (NIZORAL ) 2 % cream Apply 1 Application topically daily. 07/05/22   Christopher Savannah, PA-C  naproxen  (NAPROSYN ) 500 MG tablet Take 1 tablet (500 mg total) by mouth 2 (two) times daily. 01/14/15   Kirichenko, Tatyana, PA-C    Family History History reviewed. No pertinent family history.  Social History Social History   Tobacco Use   Smoking status: Every Day    Current packs/day: 1.00    Types: Cigarettes   Smokeless tobacco: Never  Vaping Use   Vaping status: Never Used  Substance Use Topics   Alcohol use: Yes    Comment: 8-12 pack per day. last drank half a bottle beer around 1pm.   Drug use: Never    Types: Marijuana     Allergies   Patient has no known allergies.   Review of Systems Review of Systems  Genitourinary:  Positive for menstrual problem.     Physical Exam Triage Vital Signs ED Triage Vitals  Encounter Vitals Group     BP 03/25/24 1405 (!) 154/94     Girls Systolic BP Percentile --  Girls Diastolic BP Percentile --      Boys Systolic BP Percentile --      Boys Diastolic BP Percentile --      Pulse Rate 03/25/24 1405 (!) 115     Resp 03/25/24 1405 18     Temp 03/25/24 1405 98.4 F (36.9 C)     Temp src --      SpO2 03/25/24 1405 99 %     Weight --      Height --      Head Circumference --      Peak Flow --      Pain Score 03/25/24 1404 0     Pain Loc --      Pain Education --      Exclude from Growth Chart --    No data found.  Updated Vital Signs BP 131/84   Pulse 81   Temp 98.4 F (36.9 C)   Resp 18   LMP 01/24/2024 (Within Months)   SpO2 99%   Visual Acuity Right Eye Distance:   Left Eye Distance:   Bilateral Distance:    Right Eye Near:   Left Eye Near:     Bilateral Near:     Physical Exam Vitals and nursing note reviewed.  Constitutional:      Appearance: Normal appearance.  HENT:     Head: Normocephalic and atraumatic.  Eyes:     Pupils: Pupils are equal, round, and reactive to light.  Cardiovascular:     Rate and Rhythm: Normal rate.  Pulmonary:     Effort: Pulmonary effort is normal.  Skin:    General: Skin is warm and dry.  Neurological:     General: No focal deficit present.     Mental Status: She is alert and oriented to person, place, and time.  Psychiatric:        Mood and Affect: Mood normal.        Behavior: Behavior normal.      UC Treatments / Results  Labs (all labs ordered are listed, but only abnormal results are displayed) Labs Reviewed  POCT URINE PREGNANCY - Abnormal; Notable for the following components:      Result Value   Preg Test, Ur Positive (*)    All other components within normal limits    EKG   Radiology No results found.  Procedures Procedures (including critical care time)  Medications Ordered in UC Medications - No data to display  Initial Impression / Assessment and Plan / UC Course  I have reviewed the triage vital signs and the nursing notes.  Pertinent labs & imaging results that were available during my care of the patient were reviewed by me and considered in my medical decision making (see chart for details).     Positive UPT.  Reviewed with patient.  Advised to establish with OB for prenatal care.  Blood pressure was elevated on intake, recheck was improved and heart rate improved as well.  Follow-up PCP 1 week and ER precautions reviewed. Final Clinical Impressions(s) / UC Diagnoses   Final diagnoses:  Possible pregnancy  Positive urine pregnancy test     Discharge Instructions      Please establish with obstetrician for prenatal care.    ED Prescriptions   None    PDMP not reviewed this encounter.   Loreda Myla SAUNDERS, NP 03/25/24 1459

## 2024-03-25 NOTE — ED Triage Notes (Signed)
 Pt present with concerns of possible pregnancy. Pt took 3 tests at home, two were faintly positive and one was positive. Pt states she wants to confirm positive tests.   Pt states she has had breast tenderness and missed cycle x 1 month.

## 2024-03-27 ENCOUNTER — Encounter (HOSPITAL_BASED_OUTPATIENT_CLINIC_OR_DEPARTMENT_OTHER): Payer: Self-pay

## 2024-03-27 ENCOUNTER — Ambulatory Visit (HOSPITAL_BASED_OUTPATIENT_CLINIC_OR_DEPARTMENT_OTHER): Payer: Self-pay

## 2024-03-27 VITALS — BP 126/86 | HR 93 | Wt 135.4 lb

## 2024-03-27 DIAGNOSIS — Z3A01 Less than 8 weeks gestation of pregnancy: Secondary | ICD-10-CM

## 2024-03-27 DIAGNOSIS — N912 Amenorrhea, unspecified: Secondary | ICD-10-CM

## 2024-03-27 DIAGNOSIS — Z3200 Encounter for pregnancy test, result unknown: Secondary | ICD-10-CM

## 2024-03-27 DIAGNOSIS — Z3201 Encounter for pregnancy test, result positive: Secondary | ICD-10-CM

## 2024-03-27 DIAGNOSIS — Z3480 Encounter for supervision of other normal pregnancy, unspecified trimester: Secondary | ICD-10-CM | POA: Insufficient documentation

## 2024-03-27 DIAGNOSIS — O3680X0 Pregnancy with inconclusive fetal viability, not applicable or unspecified: Secondary | ICD-10-CM

## 2024-03-27 LAB — POCT URINE PREGNANCY: Preg Test, Ur: POSITIVE — AB

## 2024-03-27 MED ORDER — PRENATAL 27-1 MG PO TABS: 1.0000 | ORAL_TABLET | Freq: Every day | ORAL | 11 refills | Status: AC

## 2024-03-27 NOTE — Progress Notes (Signed)
 NURSE VISIT- PREGNANCY CONFIRMATION   SUBJECTIVE:  Cathy Henry is a 38 y.o. G1P0000 female at [redacted]w[redacted]d by uncertain LMP. Patient's last menstrual period was 02/04/2024 (approximate). Here for pregnancy confirmation.  Home pregnancy test: positive x 3  She reports no complaints.  She is not taking prenatal vitamins.  I explained I am completing New Nurse OB Intake today. We discussed EDD of 11/10/2024 based on approximate LMP of 10/202025. I reviewed her allergies, medications and Medical/Surgical/OB history.    There are no active problems to display for this patient.    Concerns addressed today   MyChart/Babyscripts MyChart access verified. I explained pt will have some visits in office and some virtually. Babyscripts instructions given and order placed. Patient verifies receipt of registration text/e-mail. Account successfully created and app downloaded.   Blood Pressure Cuff/Weight Scale Patient is self-pay; explained patient will be given BP cuff at first prenatal appt. Explained after first prenatal appt pt will check weekly and document in Babyscripts. Patient does not have weight scale; patient may purchase if they desire to track weight weekly in Babyscripts.  Is patient a candidate for Babyscripts Optimization? Yes, patient accepted  OBJECTIVE:  BP 126/86 (BP Location: Right Arm, Patient Position: Sitting, Cuff Size: Normal)   Pulse 93   Wt 135 lb 6.4 oz (61.4 kg)   LMP 02/04/2024 (Approximate)   SpO2 100%   BMI 22.53 kg/m   Appears well, in no apparent distress  Results for orders placed or performed in visit on 03/27/24 (from the past 24 hours)  POCT urine pregnancy   Collection Time: 03/27/24  2:57 PM  Result Value Ref Range   Preg Test, Ur Positive (A) Negative    ASSESSMENT: Positive UPT in office. Approximate LMP 02/04/2024 EDD 11/10/2024.    PLAN: Prenatal vitamins: Prenatal 27-1 mg take 1 tablet daily #30 0RF sent to pharmacy on file.   Nausea medicines:  not currently needed. New OB packet provided and reviewed with patient. Advised patient to return paperwork to new OB appointment. New OB appointment scheduled for 05/13/2024 at 9:55 am with Nidia Daring, FNP. Viability scan ordered and scheduled for 04/02/2024 at 11:30 am.  Daizy Outen E

## 2024-04-02 ENCOUNTER — Ambulatory Visit (INDEPENDENT_AMBULATORY_CARE_PROVIDER_SITE_OTHER): Payer: Self-pay

## 2024-04-02 ENCOUNTER — Encounter (HOSPITAL_BASED_OUTPATIENT_CLINIC_OR_DEPARTMENT_OTHER): Payer: Self-pay

## 2024-04-02 DIAGNOSIS — Z3687 Encounter for antenatal screening for uncertain dates: Secondary | ICD-10-CM

## 2024-04-02 DIAGNOSIS — Z3A01 Less than 8 weeks gestation of pregnancy: Secondary | ICD-10-CM

## 2024-04-02 DIAGNOSIS — O3680X0 Pregnancy with inconclusive fetal viability, not applicable or unspecified: Secondary | ICD-10-CM

## 2024-04-16 ENCOUNTER — Ambulatory Visit (HOSPITAL_BASED_OUTPATIENT_CLINIC_OR_DEPARTMENT_OTHER): Payer: Self-pay | Admitting: Obstetrics & Gynecology

## 2024-04-16 ENCOUNTER — Other Ambulatory Visit (HOSPITAL_BASED_OUTPATIENT_CLINIC_OR_DEPARTMENT_OTHER): Payer: Self-pay | Admitting: Obstetrics & Gynecology

## 2024-04-16 ENCOUNTER — Inpatient Hospital Stay (HOSPITAL_COMMUNITY): Payer: Self-pay

## 2024-04-16 ENCOUNTER — Other Ambulatory Visit: Payer: Self-pay

## 2024-04-16 ENCOUNTER — Other Ambulatory Visit (HOSPITAL_BASED_OUTPATIENT_CLINIC_OR_DEPARTMENT_OTHER): Payer: Self-pay

## 2024-04-16 ENCOUNTER — Telehealth (HOSPITAL_BASED_OUTPATIENT_CLINIC_OR_DEPARTMENT_OTHER): Payer: Self-pay

## 2024-04-16 ENCOUNTER — Inpatient Hospital Stay (HOSPITAL_COMMUNITY)
Admission: AD | Admit: 2024-04-16 | Discharge: 2024-04-16 | Disposition: A | Discharge status: Home / Self Care | Payer: Self-pay | Source: Home / Self Care | Attending: Obstetrics and Gynecology | Admitting: Obstetrics and Gynecology

## 2024-04-16 DIAGNOSIS — N939 Abnormal uterine and vaginal bleeding, unspecified: Secondary | ICD-10-CM

## 2024-04-16 DIAGNOSIS — O3680X1 Pregnancy with inconclusive fetal viability, fetus 1: Secondary | ICD-10-CM

## 2024-04-16 DIAGNOSIS — O039 Complete or unspecified spontaneous abortion without complication: Secondary | ICD-10-CM

## 2024-04-16 DIAGNOSIS — Z3480 Encounter for supervision of other normal pregnancy, unspecified trimester: Secondary | ICD-10-CM

## 2024-04-16 DIAGNOSIS — Z3A10 10 weeks gestation of pregnancy: Secondary | ICD-10-CM

## 2024-04-16 LAB — ABO/RH
ABO/RH(D): AB NEG
Antibody Screen: NEGATIVE

## 2024-04-16 LAB — URINALYSIS, ROUTINE W REFLEX MICROSCOPIC
Bilirubin Urine: NEGATIVE
Glucose, UA: NEGATIVE mg/dL
Ketones, ur: 5 mg/dL — AB
Leukocytes,Ua: NEGATIVE
Nitrite: NEGATIVE
Protein, ur: NEGATIVE mg/dL
RBC / HPF: >50 RBC/hpf (ref 0–5)
Specific Gravity, Urine: 1.004 — ABNORMAL LOW (ref 1.005–1.030)
pH: 6 (ref 5.0–8.0)

## 2024-04-16 LAB — CBC
HCT: 37.7 % (ref 36.0–46.0)
Hemoglobin: 13.5 g/dL (ref 12.0–15.0)
MCH: 32.3 pg (ref 26.0–34.0)
MCHC: 35.8 g/dL (ref 30.0–36.0)
MCV: 90.2 fL (ref 80.0–100.0)
Platelets: 343 K/uL (ref 150–400)
RBC: 4.18 MIL/uL (ref 3.87–5.11)
RDW: 11 % — ABNORMAL LOW (ref 11.5–15.5)
WBC: 12.8 K/uL — ABNORMAL HIGH (ref 4.0–10.5)
nRBC: 0 % (ref 0.0–0.2)

## 2024-04-16 LAB — WET PREP, GENITAL
Clue Cells Wet Prep HPF POC: NONE SEEN
Sperm: NONE SEEN
Trich, Wet Prep: NONE SEEN
WBC, Wet Prep HPF POC: >=10 — AB
Yeast Wet Prep HPF POC: NONE SEEN

## 2024-04-16 LAB — HCG, QUANTITATIVE, PREGNANCY: hCG, Beta Chain, Quant, S: 9221 m[IU]/mL — ABNORMAL HIGH

## 2024-04-16 MED ORDER — CYCLOBENZAPRINE HCL 5 MG PO TABS: 5.0000 mg | ORAL_TABLET | Freq: Three times a day (TID) | ORAL | 0 refills | Status: AC | PRN

## 2024-04-16 MED ORDER — IBUPROFEN 800 MG PO TABS
800.0000 mg | ORAL_TABLET | Freq: Once | ORAL | Status: AC
  Administered 2024-04-16: 800 mg via ORAL
  Filled 2024-04-16: qty 1

## 2024-04-16 NOTE — MAU Provider Note (Signed)
 " None     S Ms. AASHKA SALOMONE is a 38 y.o. G1P0000 pregnant female at [redacted]w[redacted]d who presents to MAU today with complaint of vaginal bleeding that started last night and increased through the day. Bleeding and cramping initially experienced better upon arrival to hospital. Pain slightly worse after sitting for several hours, while awaiting results. Patient offered ibuprofen 800 mg and accepted.  Partner present and supportive.  Receives care at [facility]. Prenatal records reviewed.  Pertinent items noted in HPI and remainder of comprehensive ROS otherwise negative.   O BP 111/77 (BP Location: Right Arm)   Pulse 85   Temp 98.1 F (36.7 C) (Oral)   Resp 18   Ht 5' 5 (1.651 m)   Wt 59.5 kg   LMP 02/04/2024 (Approximate)   SpO2 100%   BMI 21.82 kg/m  Physical Exam Vitals reviewed.  Constitutional:      General: She is not in acute distress.    Appearance: Normal appearance. She is well-developed. She is not ill-appearing, toxic-appearing or diaphoretic.  HENT:     Head: Normocephalic.  Cardiovascular:     Rate and Rhythm: Normal rate.     Pulses: Normal pulses.     Heart sounds: Normal heart sounds.  Pulmonary:     Effort: Pulmonary effort is normal.  Skin:    General: Skin is warm and dry.     Capillary Refill: Capillary refill takes less than 2 seconds.  Neurological:     General: No focal deficit present.     Mental Status: She is alert and oriented to person, place, and time.  Psychiatric:        Mood and Affect: Mood normal.        Behavior: Behavior normal.      MDM:  Moderate  MAU Course:  IUP previously seen on US  no longer present consistent with miscarriage, likely in progress. Given patient with reasonably well controlled pain, and well supported by partner, reasonable to discharge home.  Consulted Dr. Erik who agreed that follow up in clinic in 1-2 weeks appropriate with good precautions.   A Supervision of other normal pregnancy, antepartum  Miscarriage -  Plan: Discharge patient  Vaginal bleeding  Medical screening exam complete  P Support and consolation offered to patient with disclosure of diagnosis. Patient appropriately tearful, supported by partner. SABRA  Discharge from MAU in stable condition with bleeding and infection precautions. Patient reports slight increase in pain, no relief following administration of motrin prior to DC but prefers to go home.  Patient and partner informed of return precautions, and able to teach back.  Follow up at [facility], message sent to schedule patient in 1-2 weeks.    Allergies as of 04/16/2024   No Known Allergies      Medication List     STOP taking these medications    acetaminophen  325 MG tablet Commonly known as: TYLENOL    amoxicillin -clavulanate 875-125 MG tablet Commonly known as: AUGMENTIN    aspirin-acetaminophen -caffeine 250-250-65 MG tablet Commonly known as: EXCEDRIN MIGRAINE   Aspirin-Acetaminophen -Caffeine 260-130-16 MG Tabs   cetirizine  10 MG tablet Commonly known as: ZyrTEC  Allergy   fluconazole  150 MG tablet Commonly known as: DIFLUCAN    FLUoxetine 40 MG capsule Commonly known as: PROZAC   ibuprofen 200 MG tablet Commonly known as: ADVIL   ipratropium 0.03 % nasal spray Commonly known as: ATROVENT    ketoconazole  2 % cream Commonly known as: NIZORAL    naproxen  500 MG tablet Commonly known as: NAPROSYN   TAKE these medications    cyclobenzaprine 5 MG tablet Commonly known as: FLEXERIL Take 1 tablet (5 mg total) by mouth 3 (three) times daily as needed.   Prenatal 27-1 MG Tabs Take 1 tablet by mouth daily.        Camie Rote, MSN, CNM 04/16/2024 8:38 PM  Certified Nurse Midwife, [facility]  "

## 2024-04-16 NOTE — MAU Note (Signed)
 Cathy Henry is a 38 y.o. at [redacted]w[redacted]d here in MAU reporting: she's having VB and cramping.  Reports VB started last night as spotting and has increased.  RN assess sanitary napkin pt is currently wearing, light VB noted.  Reports both VB and cramping have decreased since arrival at hospital.   Had an appointment for an US  this morning, missed appt secondary cramping & VB.  LMP: 02/04/2024 Onset of complaint: last night Pain score: 6 Vitals:   04/16/24 1546  BP: 111/77  Pulse: 85  Resp: 18  Temp: 98.1 F (36.7 C)  SpO2: 100%     FHT: Attempted to auscultate, no FHT heard  Lab orders placed from triage: UA

## 2024-04-16 NOTE — Progress Notes (Signed)
 Pt states pain unrelieved and feels worse.  Requesting to go and return if pain doesn't resolve with Motrin given at 1859.  CANDIE Rote, CNM notified regarding pt request.  CNM states may go home, will discharge home.

## 2024-04-16 NOTE — Discharge Instructions (Signed)
 Ms Ackerley,  [place] so sorry for your loss, I will be thinking of you and your family.   As we discussed, it is important to return to [facility] with  - signs of infection such as foul smelling bleeding, fever or chills  - heavy vaginal bleeding (like filling a pad in an hour or less for 2 hours) especially if you feel dizzy or lightheaded.  - pain unrelieved by acetaminophen  (1000mg  every 6 hours) and motrin (800mg  every 8 hours).   I am sending a message for the office to get you scheduled for an appointment in approximately 2 weeks to check in on your bleeding.   Please continue taking your prenatal vitamins.  Thank you for trusting us  to care for you, Camie, Midwife

## 2024-04-16 NOTE — Telephone Encounter (Signed)
 Patient left voicemail on nurses' line stating that she has developed bleeding over night and wanting to know what she should do.   Spoke with patient, she was having some slight bright red spotting last night. Over night around 2 am she woke up with extreme cramping. Patient states that she has not been bleeding heavily but is still having some bright red bleeding and extreme cramping. She denies any clots at this time. Patient provided with bleeding precautions and scheduled to see Dr. Cleotilde this afternoon at 2:55 pm. Patient verbalized understanding with no further questions or concerns at this time.  Morna LOISE Quale, RN

## 2024-04-16 NOTE — Progress Notes (Deleted)
" ° °  GYNECOLOGY  VISIT  CC:   No chief complaint on file.   HPI: 38 y.o. G1P0000 Single   Other or two or more races White or Caucasian female here for bleeding in early pregnancy.  Patient's last menstrual period was 02/04/2024 (approximate).  Past Medical History:  Diagnosis Date   Anxiety    Depression     MEDS:  Reviewed in EPIC  ALLERGIES: Patient has no known allergies.  SH:  ***  ROS  PHYSICAL EXAMINATION:    LMP 02/04/2024 (Approximate)     General appearance: alert, cooperative and appears stated age Neck: no adenopathy, supple, symmetrical, trachea midline and thyroid {CHL AMB PHY EX THYROID NORM DEFAULT:[phone]::normal to inspection and palpation} CV:  {Exam; heart brief:31539} Lungs:  {pe lungs ob:314451} Breasts: {Exam; breast:13139::normal appearance, no masses or tenderness} Abdomen: soft, non-tender; bowel sounds normal; no masses,  no organomegaly Lymph:  no inguinal LAD noted  Pelvic: External genitalia:  no lesions              Urethra:  normal appearing urethra with no masses, tenderness or lesions              Bartholins and Skenes: normal                 Vagina: {exam; pelvic vaginal:30846}              Cervix: {CHL AMB PHY EX CERVIX NORM DEFAULT:[phone]::no lesions}              Bimanual Exam:  Uterus:  {CHL AMB PHY EX UTERUS NORM DEFAULT:[phone]::normal size, contour, position, consistency, mobility, non-tender}              Adnexa: {CHL AMB PHY EX ADNEXA NO MASS [phone]::no mass, fullness, tenderness}              Rectovaginal: {yes no:[id number]}.  Confirms.              Anus:  normal sphincter tone, no lesions  Chaperone was present for exam.  Assessment/Plan: There are no diagnoses linked to this encounter.  "

## 2024-04-18 LAB — GC/CHLAMYDIA PROBE AMP (~~LOC~~) NOT AT ARMC
Chlamydia: NEGATIVE
Comment: NEGATIVE
Comment: NORMAL
Neisseria Gonorrhea: NEGATIVE

## 2024-04-21 ENCOUNTER — Encounter (HOSPITAL_BASED_OUTPATIENT_CLINIC_OR_DEPARTMENT_OTHER): Payer: Self-pay

## 2024-04-29 ENCOUNTER — Ambulatory Visit (HOSPITAL_BASED_OUTPATIENT_CLINIC_OR_DEPARTMENT_OTHER): Payer: Self-pay | Admitting: Obstetrics and Gynecology

## 2024-05-13 ENCOUNTER — Encounter (HOSPITAL_BASED_OUTPATIENT_CLINIC_OR_DEPARTMENT_OTHER): Payer: Self-pay | Admitting: Obstetrics and Gynecology
